# Patient Record
Sex: Female | Born: 1995 | Race: Black or African American | Hispanic: No | Marital: Single | Smoking: Never smoker
Health system: Southern US, Community
[De-identification: ages and names within clinical notes are randomized; demographics above are authoritative.]

## PROBLEM LIST (undated history)

## (undated) DIAGNOSIS — D649 Anemia, unspecified: Secondary | ICD-10-CM

## (undated) HISTORY — PX: NO PAST SURGERIES: SHX2092

---
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
## 2013-11-05 ENCOUNTER — Other Ambulatory Visit: Payer: Self-pay | Admitting: Physician Assistant

## 2013-11-05 DIAGNOSIS — N6321 Unspecified lump in the left breast, upper outer quadrant: Secondary | ICD-10-CM

## 2013-11-11 ENCOUNTER — Inpatient Hospital Stay: Admission: RE | Admit: 2013-11-11 | Payer: Self-pay | Source: Ambulatory Visit

## 2014-01-08 ENCOUNTER — Other Ambulatory Visit: Payer: Self-pay

## 2014-08-23 ENCOUNTER — Encounter (HOSPITAL_COMMUNITY): Payer: Self-pay | Admitting: Emergency Medicine

## 2015-04-22 ENCOUNTER — Emergency Department (HOSPITAL_COMMUNITY)
Admission: EM | Admit: 2015-04-22 | Discharge: 2015-04-22 | Discharge status: Absent without leave | Payer: Self-pay | Source: Home / Self Care

## 2016-09-25 ENCOUNTER — Ambulatory Visit (HOSPITAL_COMMUNITY)
Admission: EM | Admit: 2016-09-25 | Discharge: 2016-09-25 | Disposition: A | Discharge status: Home / Self Care | Payer: Self-pay | Attending: Family Medicine | Admitting: Family Medicine

## 2016-09-25 ENCOUNTER — Encounter (HOSPITAL_COMMUNITY): Payer: Self-pay | Admitting: Emergency Medicine

## 2016-09-25 DIAGNOSIS — M542 Cervicalgia: Secondary | ICD-10-CM

## 2016-09-25 DIAGNOSIS — G43001 Migraine without aura, not intractable, with status migrainosus: Secondary | ICD-10-CM

## 2016-09-25 MED ORDER — KETOROLAC TROMETHAMINE 60 MG/2ML IM SOLN
60.0000 mg | Freq: Once | INTRAMUSCULAR | Status: AC
  Administered 2016-09-25: 60 mg via INTRAMUSCULAR

## 2016-09-25 MED ORDER — CYCLOBENZAPRINE HCL 5 MG PO TABS: 5.0000 mg | ORAL_TABLET | Freq: Every day | ORAL | 0 refills | Status: DC

## 2016-09-25 MED ORDER — NAPROXEN 500 MG PO TABS: 500.0000 mg | ORAL_TABLET | Freq: Two times a day (BID) | ORAL | 0 refills | Status: DC

## 2016-09-25 MED ORDER — KETOROLAC TROMETHAMINE 60 MG/2ML IM SOLN
INTRAMUSCULAR | Status: AC
  Filled 2016-09-25: qty 2
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
## 2016-12-17 ENCOUNTER — Ambulatory Visit (HOSPITAL_COMMUNITY)
Admission: EM | Admit: 2016-12-17 | Discharge: 2016-12-17 | Disposition: A | Discharge status: Home / Self Care | Payer: Self-pay | Attending: Family Medicine | Admitting: Family Medicine

## 2016-12-17 ENCOUNTER — Encounter (HOSPITAL_COMMUNITY): Payer: Self-pay | Admitting: Emergency Medicine

## 2016-12-17 DIAGNOSIS — R109 Unspecified abdominal pain: Secondary | ICD-10-CM

## 2016-12-17 DIAGNOSIS — F419 Anxiety disorder, unspecified: Secondary | ICD-10-CM

## 2016-12-17 DIAGNOSIS — R110 Nausea: Secondary | ICD-10-CM

## 2016-12-17 DIAGNOSIS — K529 Noninfective gastroenteritis and colitis, unspecified: Secondary | ICD-10-CM

## 2016-12-17 MED ORDER — ONDANSETRON 4 MG PO TBDP: 4.0000 mg | ORAL_TABLET | Freq: Three times a day (TID) | ORAL | 0 refills | Status: DC | PRN

## 2016-12-17 MED ORDER — HYOSCYAMINE SULFATE 0.125 MG PO TABS: ORAL_TABLET | ORAL | 0 refills | Status: DC

## 2016-12-17 NOTE — Discharge Instructions (Signed)
[facility] for an appointment to talk about anxiety

## 2016-12-17 NOTE — ED Triage Notes (Signed)
Pt stated abdominal cramping with N/V/D started this morning.

## 2016-12-17 NOTE — ED Provider Notes (Signed)
CSN: [id number]     Arrival date & time 12/17/16  1002 History   First MD Initiated Contact with Patient 12/17/16 1033     Chief Complaint  Patient presents with  . Abdominal Cramping   (Consider location/radiation/quality/duration/timing/severity/associated sxs/prior Treatment) Patient c/o abdominal spasms and pain.  She c/o NVD that started this am.  She c/o anxiety problems and states this makes her abdominal pain worse.   The history is provided by the patient.  Abdominal Cramping  This is a new problem. The current episode started 12 to 24 hours ago. The problem occurs constantly. The problem has not changed since onset.Nothing aggravates the symptoms.    Past Medical History:  Diagnosis Date  . Anemia   . Anemia   . Asthma    Past Surgical History:  Procedure Laterality Date  . NO PAST SURGERIES     History reviewed. No pertinent family history. Social History  Substance Use Topics  . Smoking status: Never Smoker  . Smokeless tobacco: Not on file  . Alcohol use No   OB History    Gravida Para Term Preterm AB Living   1 1 1     1    SAB TAB Ectopic Multiple Live Births           1     Review of Systems  Constitutional: Positive for fatigue.  HENT: Negative.   Eyes: Negative.   Respiratory: Negative.   Cardiovascular: Negative.   Gastrointestinal: Positive for diarrhea and nausea.  Endocrine: Negative.   Genitourinary: Negative.   Musculoskeletal: Negative.   Skin: Negative.   Allergic/Immunologic: Negative.   Neurological: Negative.   Hematological: Negative.   Psychiatric/Behavioral: Negative.     Allergies  Food  Home Medications   Prior to Admission medications   Medication Sig Start Date End Date Taking? Authorizing Provider  ibuprofen (ADVIL,MOTRIN) 600 MG tablet Take 1 tab PO Q6h x 2 days then Q6h prn 01/13/12  Yes Lowanda Foster, NP  hyoscyamine (LEVSIN) 0.125 MG tablet Take one po q 4 hours prn abdominal cramps 12/17/16   Deatra Canter, FNP   ondansetron (ZOFRAN ODT) 4 MG disintegrating tablet Take 1 tablet (4 mg total) by mouth every 8 (eight) hours as needed for nausea or vomiting. 12/17/16   Deatra Canter, FNP   Meds Ordered and Administered this Visit  Medications - No data to display  BP 114/75 (BP Location: Left Arm)   Pulse 72   Temp 98.2 F (36.8 C) (Oral)   Resp 18   SpO2 100%  No data found.   Physical Exam  Constitutional: She appears well-developed and well-nourished.  HENT:  Head: Normocephalic and atraumatic.  Right Ear: External ear normal.  Left Ear: External ear normal.  Mouth/Throat: Oropharynx is clear and moist.  Eyes: Conjunctivae and EOM are normal. Pupils are equal, round, and reactive to light.  Neck: Normal range of motion. Neck supple.  Cardiovascular: Normal rate, regular rhythm and normal heart sounds.   Pulmonary/Chest: Effort normal and breath sounds normal.  Abdominal: Soft. Bowel sounds are normal.  Nursing note and vitals reviewed.   Urgent Care Course     Procedures (including critical care time)  Labs Review Labs Reviewed - No data to display  Imaging Review No results found.   Visual Acuity Review  Right Eye Distance:   Left Eye Distance:   Bilateral Distance:    Right Eye Near:   Left Eye Near:    Bilateral Near:  MDM   1. Gastroenteritis   2. Abdominal spasms   3. Nausea   4. Anxiety    Referral to PCP given to talk about anxiety problems. Zofran ODT 4mg  one po tid prn #21 Levsin 0.125mg  one po q 4 hours prn abdominal cramps #30  Discussed taking otc probiotics      Deatra CanterWilliam J Oxford, FNP 12/17/16 1118

## 2017-07-23 ENCOUNTER — Emergency Department (HOSPITAL_COMMUNITY)
Admission: EM | Admit: 2017-07-23 | Discharge: 2017-07-23 | Disposition: A | Discharge status: Home / Self Care | Payer: Self-pay | Attending: Emergency Medicine | Admitting: Emergency Medicine

## 2017-07-23 ENCOUNTER — Encounter (HOSPITAL_COMMUNITY): Payer: Self-pay | Admitting: Emergency Medicine

## 2017-07-23 DIAGNOSIS — Z5321 Procedure and treatment not carried out due to patient leaving prior to being seen by health care provider: Secondary | ICD-10-CM | POA: Insufficient documentation

## 2017-07-23 LAB — COMPREHENSIVE METABOLIC PANEL
ALT: 19 U/L (ref 14–54)
ANION GAP: 9 (ref 5–15)
AST: 24 U/L (ref 15–41)
Albumin: 4.3 g/dL (ref 3.5–5.0)
Alkaline Phosphatase: 50 U/L (ref 38–126)
BUN: 7 mg/dL (ref 6–20)
CHLORIDE: 101 mmol/L (ref 101–111)
CO2: 23 mmol/L (ref 22–32)
Calcium: 9.1 mg/dL (ref 8.9–10.3)
Creatinine, Ser: 0.6 mg/dL (ref 0.44–1.00)
GFR calc non Af Amer: >60 mL/min (ref >60)
Glucose, Bld: 119 mg/dL — ABNORMAL HIGH (ref 65–99)
POTASSIUM: 3.5 mmol/L (ref 3.5–5.1)
SODIUM: 133 mmol/L — AB (ref 135–145)
Total Bilirubin: 0.5 mg/dL (ref 0.3–1.2)
Total Protein: 7.6 g/dL (ref 6.5–8.1)

## 2017-07-23 LAB — CBC
HCT: 36.5 % (ref 36.0–46.0)
Hemoglobin: 12.5 g/dL (ref 12.0–15.0)
MCH: 30.3 pg (ref 26.0–34.0)
MCHC: 34.2 g/dL (ref 30.0–36.0)
MCV: 88.6 fL (ref 78.0–100.0)
Platelets: 267 10*3/uL (ref 150–400)
RBC: 4.12 MIL/uL (ref 3.87–5.11)
RDW: 13.1 % (ref 11.5–15.5)
WBC: 12 10*3/uL — ABNORMAL HIGH (ref 4.0–10.5)

## 2017-07-23 LAB — I-STAT BETA HCG BLOOD, ED (MC, WL, AP ONLY): I-stat hCG, quantitative: <5 m[IU]/mL (ref <5)

## 2017-07-23 LAB — LIPASE, BLOOD: LIPASE: 28 U/L (ref 11–51)

## 2017-07-23 NOTE — ED Triage Notes (Signed)
Patient here from home with complaints of right upper and lower abdominal pain that started last night. Nausea/ vomiting. Hx of GERD.

## 2017-07-23 NOTE — ED Notes (Signed)
Pt LWBS 

## 2017-07-23 NOTE — ED Notes (Signed)
Pt called for room. No answer 

## 2017-07-23 NOTE — ED Notes (Signed)
Called Pt to be roomed, no response.

## 2018-03-22 ENCOUNTER — Encounter (HOSPITAL_COMMUNITY): Payer: Self-pay | Admitting: Emergency Medicine

## 2018-03-22 ENCOUNTER — Ambulatory Visit (HOSPITAL_COMMUNITY)
Admission: EM | Admit: 2018-03-22 | Discharge: 2018-03-22 | Disposition: A | Discharge status: Home / Self Care | Payer: Self-pay | Attending: Internal Medicine | Admitting: Internal Medicine

## 2018-03-22 ENCOUNTER — Other Ambulatory Visit: Payer: Self-pay

## 2018-03-22 DIAGNOSIS — H60501 Unspecified acute noninfective otitis externa, right ear: Secondary | ICD-10-CM

## 2018-03-22 DIAGNOSIS — H9201 Otalgia, right ear: Secondary | ICD-10-CM

## 2018-03-22 MED ORDER — NEOMYCIN-POLYMYXIN-HC 3.5-10000-1 OT SUSP: 4.0000 [drp] | Freq: Four times a day (QID) | OTIC | 0 refills | Status: AC

## 2018-03-22 NOTE — ED Triage Notes (Signed)
[facility] a [facility] of right ear pain and drainage x 10 days.

## 2018-03-22 NOTE — ED Provider Notes (Signed)
[facility]    CSN: [id number] Arrival date & time: 03/22/18  1326     History   Chief Complaint Chief Complaint  Patient presents with  . Otalgia    HPI Alyssa Andersen is a 22 y.o. female history of asthma presenting today for evaluation of right ear drainage and pain.  Patient states that for the past 10 days she has had pain in her right ear, will notice some drainage especially when lying on her right side.  Pain is worse at night.  Been taking ibuprofen which does help with her pain.  Also noticing decreased hearing in this ear.  States she is starting to feel pain in her left ear as well, but is not as bad as the right side.  Denies associated congestion, sore throat and coughing.  Denies fevers.  Does feel like her symptoms started after she went to bed with wet hair one night.  Denies recent swimming.  HPI  Past Medical History:  Diagnosis Date  . Anemia   . Anemia   . Asthma     Patient Active Problem List   Diagnosis Date Noted  . Asthma     Past Surgical History:  Procedure Laterality Date  . NO PAST SURGERIES      OB History    Gravida  1   Para  1   Term  1   Preterm      AB      Living  1     SAB      TAB      Ectopic      Multiple      Live Births  1            Home Medications    Prior to Admission medications   Medication Sig Start Date End Date Taking? Authorizing Provider  ibuprofen (ADVIL,MOTRIN) 600 MG tablet Take 1 tab PO Q6h x 2 days then Q6h prn 01/13/12  Yes Brewer, Mindy, NP  neomycin-polymyxin-hydrocortisone (CORTISPORIN) 3.5-10000-1 OTIC suspension Place 4 drops into the right ear 4 (four) times daily for 7 days. 03/22/18 03/29/18  Scot Shiraishi, Junius Creamer, PA-C    Family History History reviewed. No pertinent family history.  Social History Social History   Tobacco Use  . Smoking status: Never Smoker  . Smokeless tobacco: Never Used  Substance Use Topics  . Alcohol use: No  . Drug use: No      Allergies   Food   Review of Systems Review of Systems  Constitutional: Negative for chills, fatigue and fever.  HENT: Positive for ear pain. Negative for congestion, rhinorrhea, sinus pressure, sore throat and trouble swallowing.   Respiratory: Negative for cough, chest tightness and shortness of breath.   Cardiovascular: Negative for chest pain.  Gastrointestinal: Negative for abdominal pain, nausea and vomiting.  Musculoskeletal: Negative for myalgias.  Skin: Negative for rash.  Neurological: Negative for dizziness, light-headedness and headaches.     Physical Exam Triage Vital Signs ED Triage Vitals [03/22/18 1423]  Enc Vitals Group     BP 111/70     Pulse Rate 77     Resp 18     Temp 98.2 F (36.8 C)     Temp Source Oral     SpO2 96 %     Weight      Height      Head Circumference      Peak Flow      Pain Score 0  Pain Loc      Pain Edu?      Excl. in GC?    No data found.  Updated Vital Signs BP 111/70 (BP Location: Right Arm)   Pulse 77   Temp 98.2 F (36.8 C) (Oral)   Resp 18   SpO2 96%   Visual Acuity Right Eye Distance:   Left Eye Distance:   Bilateral Distance:    Right Eye Near:   Left Eye Near:    Bilateral Near:     Physical Exam  Constitutional: She appears well-developed and well-nourished. No distress.  HENT:  Head: Normocephalic and atraumatic.  Mouth/Throat: Oropharynx is clear and moist.  Eyes: Conjunctivae are normal.  Left EAC patent, TM nonerythematous, right ear with tenderness to palpation of tragus, nontender to palpation of auricle and mastoid, EAC with erythema and swelling, TM partially visualized, but did not appear erythematous.  Neck: Neck supple.  Cardiovascular: Normal rate and regular rhythm.  No murmur heard. Pulmonary/Chest: Effort normal and breath sounds normal. No respiratory distress.  Abdominal: Soft. There is no tenderness.  Musculoskeletal: She exhibits no edema.  Neurological: She is alert.   Skin: Skin is warm and dry.  Psychiatric: She has a normal mood and affect.  Nursing note and vitals reviewed.    UC Treatments / Results  Labs (all labs ordered are listed, but only abnormal results are displayed) Labs Reviewed - No data to display  EKG None  Radiology No results found.  Procedures Procedures (including critical care time)  Medications Ordered in UC Medications - No data to display  Initial Impression / Assessment and Plan / UC Course  I have reviewed the triage vital signs and the nursing notes.  Pertinent labs & imaging results that were available during my care of the patient were reviewed by me and considered in my medical decision making (see chart for details).     Patient with otitis externa, will treat with Cortisporin.  Also discussed trying to keep ear dry.  Tylenol and ibuprofen for further pain management.Discussed strict return precautions. Patient verbalized understanding and is agreeable with plan.  Final Clinical Impressions(s) / UC Diagnoses   Final diagnoses:  Acute otitis externa of right ear, unspecified type     Discharge Instructions     Use 1-2 ear drops every 4-6 hours daily for 7 days  Use hair dryer after showering to dry ear canal  Follow up if symptoms not improving or worsening   ED Prescriptions    Medication Sig Dispense Auth. Provider   neomycin-polymyxin-hydrocortisone (CORTISPORIN) 3.5-10000-1 OTIC suspension Place 4 drops into the right ear 4 (four) times daily for 7 days. 10 mL Mat Stuard C, PA-C     Controlled Substance Prescriptions [place] Controlled Substance Registry consulted? Not Applicable   Lew DawesWieters, Fabiana Dromgoole C, [place]PA-C 03/22/18 1502

## 2018-03-22 NOTE — Discharge Instructions (Signed)
Use 1-2 ear drops every 4-6 hours daily for 7 days  Use hair dryer after showering to dry ear canal  Follow up if symptoms not improving or worsening

## 2018-10-20 ENCOUNTER — Ambulatory Visit (HOSPITAL_COMMUNITY)
Admission: EM | Admit: 2018-10-20 | Discharge: 2018-10-20 | Disposition: A | Discharge status: Home / Self Care | Payer: Medicaid Other | Attending: Physician Assistant | Admitting: Physician Assistant

## 2018-10-20 ENCOUNTER — Encounter (HOSPITAL_COMMUNITY): Payer: Self-pay | Admitting: Physician Assistant

## 2018-10-20 ENCOUNTER — Other Ambulatory Visit: Payer: Self-pay

## 2018-10-20 DIAGNOSIS — J069 Acute upper respiratory infection, unspecified: Secondary | ICD-10-CM | POA: Insufficient documentation

## 2018-10-20 MED ORDER — IPRATROPIUM BROMIDE 0.06 % NA SOLN: 2.0000 | Freq: Four times a day (QID) | NASAL | 0 refills | Status: DC

## 2018-10-20 MED ORDER — BENZONATATE 100 MG PO CAPS: 100.0000 mg | ORAL_CAPSULE | Freq: Three times a day (TID) | ORAL | 0 refills | Status: DC

## 2018-10-20 MED ORDER — IBUPROFEN 800 MG PO TABS: 800.0000 mg | ORAL_TABLET | Freq: Three times a day (TID) | ORAL | 0 refills | Status: DC

## 2018-10-20 NOTE — Discharge Instructions (Signed)
Tessalon for cough. Ibuprofen for pain/fever. Continue dayquil/nyquil as needed. Start atrovent nasal spray for nasal congestion/drainage. You can use over the counter nasal saline rinse such as neti pot for nasal congestion. Keep hydrated, your urine should be clear to pale yellow in color. Tylenol/motrin for fever and pain. Monitor for any worsening of symptoms, chest pain, shortness of breath, wheezing, swelling of the throat, follow up for reevaluation.   For sore throat/cough try using a honey-based tea. Use 3 teaspoons of honey with juice squeezed from half lemon. Place shaved pieces of ginger into 1/2-1 cup of water and warm over stove top. Then mix the ingredients and repeat every 4 hours as needed.

## 2018-10-20 NOTE — ED Provider Notes (Signed)
[facility]    CSN: [id number] Arrival date & time: 10/20/18  1622     History   Chief Complaint Chief Complaint  Patient presents with  . Cough    HPI Alyssa Andersen is a 22 y.o. female.   22 year old female comes in for 2 day history of URI symptoms. Has had cough, body aches, headaches, subjective fever. Has had decreased appetite. Denies rhinorrhea, nasal congestion. Denies nausea, vomiting abdominal pain. Has been taking otc cold medicine with temporary relief. Current some day smoker. No obvious sick contact. Has not needed to use albuterol inhaler.      Past Medical History:  Diagnosis Date  . Anemia   . Anemia   . Asthma     Patient Active Problem List   Diagnosis Date Noted  . Asthma     Past Surgical History:  Procedure Laterality Date  . NO PAST SURGERIES      OB History    Gravida  1   Para  1   Term  1   Preterm      AB      Living  1     SAB      TAB      Ectopic      Multiple      Live Births  1            Home Medications    Prior to Admission medications   Medication Sig Start Date End Date Taking? Authorizing Provider  benzonatate (TESSALON) 100 MG capsule Take 1 capsule (100 mg total) by mouth every 8 (eight) hours. 10/20/18   Cathie HoopsYu, Amy V, PA-C  ibuprofen (ADVIL,MOTRIN) 800 MG tablet Take 1 tablet (800 mg total) by mouth 3 (three) times daily. 10/20/18   Cathie HoopsYu, Amy V, PA-C  ipratropium (ATROVENT) 0.06 % nasal spray Place 2 sprays into both nostrils 4 (four) times daily. 10/20/18   Belinda FisherYu, Amy V, PA-C    Family History No family history on file.  Social History Social History   Tobacco Use  . Smoking status: Never Smoker  . Smokeless tobacco: Never Used  Substance Use Topics  . Alcohol use: No  . Drug use: No     Allergies   Food   Review of Systems Review of Systems  Reason unable to perform ROS: See HPI as above.     Physical Exam Triage Vital Signs ED Triage Vitals  Enc Vitals Group      BP 10/20/18 1723 124/78     Pulse --      Resp 10/20/18 1723 18     Temp 10/20/18 1723 99.2 F (37.3 C)     Temp src --      SpO2 10/20/18 1723 100 %     Weight 10/20/18 1722 150 lb (68 kg)     Height --      Head Circumference --      Peak Flow --      Pain Score --      Pain Loc --      Pain Edu? --      Excl. in GC? --    No data found.  Updated Vital Signs BP 124/78 (BP Location: Right Arm)   Temp 99.2 F (37.3 C)   Resp 18   Wt 150 lb (68 kg)   LMP 09/30/2018   SpO2 100%   Physical Exam Constitutional:      General: She is not in acute distress.  Appearance: She is well-developed. She is not ill-appearing, toxic-appearing or diaphoretic.  HENT:     Head: Normocephalic and atraumatic.     Right Ear: Tympanic membrane, ear canal and external ear normal. Tympanic membrane is not erythematous or bulging.     Left Ear: Tympanic membrane, ear canal and external ear normal. Tympanic membrane is not erythematous or bulging.     Nose: Nose normal.     Right Sinus: No maxillary sinus tenderness or frontal sinus tenderness.     Left Sinus: No maxillary sinus tenderness or frontal sinus tenderness.     Mouth/Throat:     Pharynx: Uvula midline.  Eyes:     Conjunctiva/sclera: Conjunctivae normal.     Pupils: Pupils are equal, round, and reactive to light.  Neck:     Musculoskeletal: Normal range of motion and neck supple.  Cardiovascular:     Rate and Rhythm: Normal rate and regular rhythm.     Heart sounds: Normal heart sounds. No murmur. No friction rub. No gallop.   Pulmonary:     Effort: Pulmonary effort is normal.     Breath sounds: Normal breath sounds. No decreased breath sounds, wheezing, rhonchi or rales.  Lymphadenopathy:     Cervical: No cervical adenopathy.  Skin:    General: Skin is warm and dry.  Neurological:     Mental Status: She is alert and oriented to person, place, and time.  Psychiatric:        Behavior: Behavior normal.        Judgment:  Judgment normal.      UC Treatments / Results  Labs (all labs ordered are listed, but only abnormal results are displayed) Labs Reviewed - No data to display  EKG None  Radiology No results found.  Procedures Procedures (including critical care time)  Medications Ordered in UC Medications - No data to display  Initial Impression / Assessment and Plan / UC Course  I have reviewed the triage vital signs and the nursing notes.  Pertinent labs & imaging results that were available during my care of the patient were reviewed by me and considered in my medical decision making (see chart for details).    Discussed with patient history and exam most consistent with viral URI. Symptomatic treatment as needed. Push fluids. Return precautions given.   Final Clinical Impressions(s) / UC Diagnoses   Final diagnoses:  Acute upper respiratory infection    ED Prescriptions    Medication Sig Dispense Auth. Provider   ipratropium (ATROVENT) 0.06 % nasal spray Place 2 sprays into both nostrils 4 (four) times daily. 15 mL Yu, Amy V, PA-C   benzonatate (TESSALON) 100 MG capsule Take 1 capsule (100 mg total) by mouth every 8 (eight) hours. 21 capsule Yu, Amy V, PA-C   ibuprofen (ADVIL,MOTRIN) 800 MG tablet Take 1 tablet (800 mg total) by mouth 3 (three) times daily. 21 tablet Threasa AlphaYu, Amy V, PA-C        Yu, Amy V, [place]PA-C 10/20/18 [phone]

## 2018-10-20 NOTE — ED Triage Notes (Signed)
Pt cc cough , body aches, fever and headaches this started yesterday..Marland Kitchen

## 2019-05-14 ENCOUNTER — Encounter (HOSPITAL_COMMUNITY): Payer: Self-pay | Admitting: Emergency Medicine

## 2019-05-14 ENCOUNTER — Ambulatory Visit (HOSPITAL_COMMUNITY)
Admission: EM | Admit: 2019-05-14 | Discharge: 2019-05-14 | Disposition: A | Discharge status: Home / Self Care | Payer: Self-pay | Attending: Emergency Medicine | Admitting: Emergency Medicine

## 2019-05-14 ENCOUNTER — Other Ambulatory Visit: Payer: Self-pay

## 2019-05-14 DIAGNOSIS — Z113 Encounter for screening for infections with a predominantly sexual mode of transmission: Secondary | ICD-10-CM | POA: Insufficient documentation

## 2019-05-14 DIAGNOSIS — Z3490 Encounter for supervision of normal pregnancy, unspecified, unspecified trimester: Secondary | ICD-10-CM | POA: Insufficient documentation

## 2019-05-14 DIAGNOSIS — N898 Other specified noninflammatory disorders of vagina: Secondary | ICD-10-CM | POA: Insufficient documentation

## 2019-05-14 LAB — POCT PREGNANCY, URINE: Preg Test, Ur: POSITIVE — AB

## 2019-05-14 MED ORDER — METRONIDAZOLE 500 MG PO TABS: 500.0000 mg | ORAL_TABLET | Freq: Two times a day (BID) | ORAL | 0 refills | Status: AC

## 2019-05-14 MED ORDER — CLOTRIMAZOLE 2 % VA CREA: 1.0000 | TOPICAL_CREAM | Freq: Every day | VAGINAL | 0 refills | Status: AC

## 2019-05-14 NOTE — ED Provider Notes (Signed)
HPI  SUBJECTIVE:  Alyssa Andersen is a G2, 55P1 23 y.o. female who presents for STD and pregnancy testing.  She reports having a painful blistery rash along her right lower outer labia/junction of upper right thigh 3 weeks ago.  This has since resolved.  She also reports odorous thick white vaginal discharge.  She tried an OTC vaginal cream without improvement in her symptoms.  finished this 2 days ago.  No other aggravating or alleviating factors.  She denies dysuria, urgency, frequency, cloudy urine.  She reports odorous urine.  No hematuria.  No vaginal itching.  No current genital rash, blisters.  No abdominal, back, pelvic pain.  No abnormal vaginal bleeding.  No fevers.  She has not taken a pregnancy test at home.  She is in a monogamous relationship with a female who is asymptomatic currently but has a possible history of herpes.  She was on antibiotics 3 weeks ago.  She states that she is using a new perfumed body wash.  She has a past medical history of gonorrhea, chlamydia, yeast infections, and UTI.  No history of HIV, HIV, syphilis, trichomonas, BV, PID, ectopic pregnancy, pyelonephritis, diabetes.  LMP: 6/18.  [facility]    Past Medical History:  Diagnosis Date  . Anemia   . Anemia   . Asthma     Past Surgical History:  Procedure Laterality Date  . NO PAST SURGERIES      No family history on file.  Social History   Tobacco Use  . Smoking status: Never Smoker  . Smokeless tobacco: Never Used  Substance Use Topics  . Alcohol use: No  . Drug use: No    No current facility-administered medications for this encounter.   Current Outpatient Medications:  .  clotrimazole (GYNE-LOTRIMIN 3) 2 % vaginal cream, Place 1 Applicatorful vaginally at bedtime., Disp: 21 g, Rfl: 0 .  metroNIDAZOLE (FLAGYL) 500 MG tablet, Take 1 tablet (500 mg total) by mouth 2 (two) times daily for 7 days., Disp: 14 tablet, Rfl: 0  Allergies  Allergen Reactions  . Food  Swelling    Bananas, apples     ROS  As noted in HPI.   Physical Exam  BP 121/71   Pulse 97   Temp 99.2 F (37.3 C) (Oral)   Resp 16   SpO2 99%   Constitutional: Well developed, well nourished, no acute distress Eyes:  EOMI, conjunctiva normal bilaterally HENT: Normocephalic, atraumatic,mucus membranes moist Respiratory: Normal inspiratory effort Cardiovascular: Normal rate GI: nondistended soft, nontender. No suprapubic tenderness  back: No CVA tenderness GU: External genitalia normal.  No blisters, ulcers.  Positive flat hypopigmented  healed rash at the junction of the right buttock and right external labia.  No crusting, active blisters.  There is nothing to swab.  Normal vaginal mucosa.  Normal os. thick oderous white vaginal discharge.   Uterus smooth, NT. No  CMT. No adnexal tenderness. No adnexal masses.  Chaperone present during exam skin: No rash, skin intact Musculoskeletal: no deformities Neurologic: Alert & oriented x 3, no focal neuro deficits Psychiatric: Speech and behavior appropriate   ED Course   Medications - No data to display  Orders Placed This Encounter  Procedures  . Pelvic exam    Standing Status:   Standing    Number of Occurrences:   1  . RPR    Standing Status:   Standing    Number of Occurrences:   1  . HIV antibody  Standing Status:   Standing    Number of Occurrences:   1  . POC urine pregnancy    Standing Status:   Standing    Number of Occurrences:   1  . Pregnancy, urine POC    Standing Status:   Standing    Number of Occurrences:   1    Results for orders placed or performed during the hospital encounter of 05/14/19 (from the past 24 hour(s))  Pregnancy, urine POC     Status: Abnormal   Collection Time: 05/14/19  1:21 PM  Result Value Ref Range   Preg Test, Ur POSITIVE (A) NEGATIVE   No results found.  ED Clinical Impression  1. Pregnancy at early stage   2. Screen for STD (sexually transmitted disease)   3.  Vaginal discharge      ED Assessment/Plan  Urine pregnancy positive.  Discussed this with patient.  She will restart prenatal vitamins-states that she does not need a prescription.  H&P most c/w yeast infection versus BV.  Will send home with clotrimazole 2% 1 applicator vaginally for 3 nights because she is pregnant, and a wait-and-see prescription of Flagyl.  She will call here if she does not hear from [place] in 2 days and start Flagyl if necessary.  Will provide an OB/GYN list for ongoing care.  She gave [place] a dirty urine sample, so was unable to test for UTI, but she has no urinary symptoms.  Suspect odorous urine is from the vaginal discharge. she was unable to give [place] an additional sample while in clinic today.  Sent off GC/chlamydia, wet prep, HIV, RPR. Will not treat empirically now.  Advised pt to refrain from sexual contact until she knows lab results, symptoms resolve, and partner(s) are treated if necessary. Pt provided working phone number. Discussed labs, MDM, plan and followup with patient. Pt agrees with plan.   Meds ordered this encounter  Medications  . metroNIDAZOLE (FLAGYL) 500 MG tablet    Sig: Take 1 tablet (500 mg total) by mouth 2 (two) times daily for 7 days.    Dispense:  14 tablet    Refill:  0  . clotrimazole (GYNE-LOTRIMIN 3) 2 % vaginal cream    Sig: Place 1 Applicatorful vaginally at bedtime.    Dispense:  21 g    Refill:  0    *This clinic note was created using [employer]. Therefore, there may be occasional mistakes despite careful proofreading.  ?    Melynda Ripple, MD 05/14/19 [phone]

## 2019-05-14 NOTE — Discharge Instructions (Addendum)
I am treating you empirically for a yeast infection.  Save the Flagyl prescription until you know what your results are.  Call here in a day or 2 if you do not hear from [place].  Start the Flagyl if your labs come back positive for bacterial vaginosis.  We have also sent off gonorrhea, chlamydia, syphilis, HIV testing as well.  Restart the prenatal vitamins.  [place] [facility] [employer] [facility] at [facility]       Phone: [phone]  [place] [facility] [employer] [facility] at [place]   Phone: [place] for [employer] [facility] at [place]  Phone: [place] for [place] [facility]  Phone: [place] [facility] [place] [facility] [place]  Phone: [place] for [place] [facility]   Phone: [facility]       Phone: [phone]  [facility]    Phone: [phone]   [facility]    Phone: [phone]  [facility]    Phone: [place]    Phone: [phone]  [place] [facility]       Phone: [phone]   [place] [facility]  Phone: [place]    Phone: [phone]  [facility]   Phone: [phone]  [facility]      Phone: [phone]  [facility]    Phone: [phone]

## 2019-05-14 NOTE — ED Triage Notes (Signed)
PT requests STD testing, herpes testing, and a pregnancy test.

## 2019-05-15 LAB — HIV ANTIBODY (ROUTINE TESTING W REFLEX): HIV Screen 4th Generation wRfx: NONREACTIVE

## 2019-05-15 LAB — RPR: RPR Ser Ql: NONREACTIVE

## 2019-05-19 LAB — CERVICOVAGINAL ANCILLARY ONLY
Bacterial vaginitis: POSITIVE — AB
Candida vaginitis: NEGATIVE
Chlamydia: POSITIVE — AB
Neisseria Gonorrhea: POSITIVE — AB
Trichomonas: NEGATIVE

## 2019-05-21 ENCOUNTER — Telehealth (HOSPITAL_COMMUNITY): Payer: Self-pay | Admitting: Emergency Medicine

## 2019-05-21 ENCOUNTER — Ambulatory Visit (HOSPITAL_COMMUNITY)
Admission: EM | Admit: 2019-05-21 | Discharge: 2019-05-21 | Disposition: A | Discharge status: Home / Self Care | Payer: Self-pay | Attending: Internal Medicine | Admitting: Internal Medicine

## 2019-05-21 DIAGNOSIS — A549 Gonococcal infection, unspecified: Secondary | ICD-10-CM

## 2019-05-21 MED ORDER — AZITHROMYCIN 250 MG PO TABS
1000.0000 mg | ORAL_TABLET | Freq: Once | ORAL | Status: AC
  Administered 2019-05-21: 16:00:00 1000 mg via ORAL

## 2019-05-21 MED ORDER — CEFTRIAXONE SODIUM 250 MG IJ SOLR
INTRAMUSCULAR | Status: AC
  Filled 2019-05-21: qty 250

## 2019-05-21 MED ORDER — CEFTRIAXONE SODIUM 250 MG IJ SOLR
250.0000 mg | Freq: Once | INTRAMUSCULAR | Status: AC
  Administered 2019-05-21: 250 mg via INTRAMUSCULAR

## 2019-05-21 MED ORDER — AZITHROMYCIN 250 MG PO TABS
ORAL_TABLET | ORAL | Status: AC
  Filled 2019-05-21: qty 4

## 2019-05-21 NOTE — Telephone Encounter (Signed)
Bacterial Vaginosis test is positive.  Prescription for metronidazole was given at the urgent care visit. Pt contacted regarding results. Answered all questions. Verbalized understanding.  Chlamydia is positive.  Rx po zithromax 1g #1 dose no refills was sent to the pharmacy of record.  Pt needs education to please refrain from sexual intercourse for 7 days to give the medicine time to work, sexual partners need to be notified and tested/treated.  Condoms may reduce risk of reinfection.  Recheck or followup with PCP for further evaluation if symptoms are not improving.   [facility] notified.  Gonorrhea is positive.  Patient should return as soon as possible to the urgent care for treatment with IM rocephin 250mg  and po zithromax 1g. Patient will not need to see a provider unless there are new symptoms she would like evaluated. Pt needs education to refrain from sexual intercourse for now and for 7 days after treatment to give the medicine time to work. Sexual partners need to be notified and tested/treated. Condoms may reduce risk of reinfection. [facility] notified.   Pt contacted, will return today for treatment.

## 2019-06-02 ENCOUNTER — Other Ambulatory Visit: Payer: Self-pay

## 2019-06-02 ENCOUNTER — Encounter (HOSPITAL_COMMUNITY): Payer: Self-pay

## 2019-06-02 ENCOUNTER — Inpatient Hospital Stay (HOSPITAL_COMMUNITY)
Admission: AD | Admit: 2019-06-02 | Discharge: 2019-06-03 | Disposition: A | Discharge status: Home / Self Care | Payer: Medicaid Other | Attending: Family Medicine | Admitting: Family Medicine

## 2019-06-02 DIAGNOSIS — O210 Mild hyperemesis gravidarum: Secondary | ICD-10-CM | POA: Diagnosis present

## 2019-06-02 DIAGNOSIS — O211 Hyperemesis gravidarum with metabolic disturbance: Secondary | ICD-10-CM | POA: Diagnosis not present

## 2019-06-02 DIAGNOSIS — O98219 Gonorrhea complicating pregnancy, unspecified trimester: Secondary | ICD-10-CM | POA: Insufficient documentation

## 2019-06-02 DIAGNOSIS — Z3A08 8 weeks gestation of pregnancy: Secondary | ICD-10-CM | POA: Diagnosis not present

## 2019-06-02 DIAGNOSIS — A749 Chlamydial infection, unspecified: Secondary | ICD-10-CM | POA: Insufficient documentation

## 2019-06-02 DIAGNOSIS — E860 Dehydration: Secondary | ICD-10-CM | POA: Diagnosis not present

## 2019-06-02 DIAGNOSIS — O98819 Other maternal infectious and parasitic diseases complicating pregnancy, unspecified trimester: Secondary | ICD-10-CM | POA: Insufficient documentation

## 2019-06-02 DIAGNOSIS — O219 Vomiting of pregnancy, unspecified: Secondary | ICD-10-CM | POA: Diagnosis not present

## 2019-06-02 DIAGNOSIS — Z20828 Contact with and (suspected) exposure to other viral communicable diseases: Secondary | ICD-10-CM | POA: Insufficient documentation

## 2019-06-02 LAB — URINALYSIS, ROUTINE W REFLEX MICROSCOPIC
Bacteria, UA: NONE SEEN
Bilirubin Urine: NEGATIVE
Glucose, UA: NEGATIVE mg/dL
Hgb urine dipstick: NEGATIVE
Ketones, ur: 80 mg/dL — AB
Nitrite: NEGATIVE
Protein, ur: 30 mg/dL — AB
Specific Gravity, Urine: 1.029 (ref 1.005–1.030)
pH: 6 (ref 5.0–8.0)

## 2019-06-02 LAB — SARS CORONAVIRUS 2 BY RT PCR (HOSPITAL ORDER, PERFORMED IN ~~LOC~~ HOSPITAL LAB): SARS Coronavirus 2: NEGATIVE

## 2019-06-02 MED ORDER — PROMETHAZINE HCL 25 MG/ML IJ SOLN
12.5000 mg | Freq: Once | INTRAMUSCULAR | Status: AC
  Administered 2019-06-02: 12.5 mg via INTRAVENOUS
  Filled 2019-06-02: qty 1

## 2019-06-02 MED ORDER — SODIUM CHLORIDE 0.9 % IV SOLN
INTRAVENOUS | Status: DC
  Administered 2019-06-02 (×2): via INTRAVENOUS

## 2019-06-02 NOTE — MAU Note (Signed)
Pt states that 2 weeks ago she started having N/V.   This week has been the worst because she can not keep anything down.   Pt reports feeling dizzy at times.

## 2019-06-02 NOTE — MAU Provider Note (Signed)
Chief Complaint: Nausea and Emesis   First Provider Initiated Contact with Patient 06/02/19 2124        SUBJECTIVE HPI: Alyssa Andersen is a 23 y.o. G2P1001 at 4839w1d by LMP who presents to maternity admissions reporting nausea and vomiting for 2 weeks, worse this week.  [facility].  .Was not prescribed any medications by office.    She denies vaginal bleeding, vaginal itching/burning, urinary symptoms, h/a.   Also complained of intermittent fever and chills, shortness of breath that comes and goes, and loss of taste.      Emesis  This is a new problem. The current episode started 1 to 4 weeks ago. The emesis has an appearance of stomach contents. Maximum temperature: Did not take temp. The fever has been present for 5 days or more. Associated symptoms include chills, dizziness and a fever. Pertinent negatives include no abdominal pain, coughing, diarrhea, headaches or myalgias. She has tried nothing for the symptoms.    Past Medical History:  Diagnosis Date  . Anemia   . Anemia   . Asthma    Past Surgical History:  Procedure Laterality Date  . NO PAST SURGERIES     Social History   Socioeconomic History  . Marital status: Single    Spouse name: Not on file  . Number of children: Not on file  . Years of education: Not on file  . Highest education level: Not on file  Occupational History  . Not on file  Social Needs  . Financial resource strain: Not on file  . Food insecurity    Worry: Not on file    Inability: Not on file  . Transportation needs    Medical: Not on file    Non-medical: Not on file  Tobacco Use  . Smoking status: Never Smoker  . Smokeless tobacco: Never Used  Substance and Sexual Activity  . Alcohol use: No  . Drug use: Yes    Types: Marijuana  . Sexual activity: Yes  Lifestyle  . Physical activity    Days per week: Not on file    Minutes per session: Not on file  . Stress: Not on file  Relationships  . Social  Musicianconnections    Talks on phone: Not on file    Gets together: Not on file    Attends religious service: Not on file    Active member of club or organization: Not on file    Attends meetings of clubs or organizations: Not on file    Relationship status: Not on file  . Intimate partner violence    Fear of current or ex partner: Not on file    Emotionally abused: Not on file    Physically abused: Not on file    Forced sexual activity: Not on file  Other Topics Concern  . Not on file  Social History Narrative  . Not on file   No current facility-administered medications on file prior to encounter.    Current Outpatient Medications on File Prior to Encounter  Medication Sig Dispense Refill  . clotrimazole (GYNE-LOTRIMIN 3) 2 % vaginal cream Place 1 Applicatorful vaginally at bedtime. 21 g 0  . [DISCONTINUED] ipratropium (ATROVENT) 0.06 % nasal spray Place 2 sprays into both nostrils 4 (four) times daily. 15 mL 0   Allergies  Allergen Reactions  . Food Swelling    Bananas, apples    I have reviewed patient's Past Medical Hx, Surgical Hx, Family Hx,  Social Hx, medications and allergies.   ROS:  Review of Systems  Constitutional: Positive for chills and fever.  Respiratory: Negative for cough.   Gastrointestinal: Positive for vomiting. Negative for abdominal pain and diarrhea.  Musculoskeletal: Negative for myalgias.  Neurological: Positive for dizziness. Negative for headaches.   Review of Systems  Other systems negative   Physical Exam  Physical Exam Patient Vitals for the past 24 hrs:  BP Temp Pulse Resp SpO2 Height Weight  06/02/19 2104 123/78 99.4 F (37.4 C) 100 16 98 % - -  06/02/19 2054 - - - - - 5\' 4"  (1.626 m) 55.9 kg   Vitals:   06/02/19 2054 06/02/19 2104 06/03/19 0130  BP:  123/78 103/61  Pulse:  100 92  Resp:  16 12  Temp:  99.4 F (37.4 C) 98.9 F (37.2 C)  SpO2:  98% 100%  Weight: 55.9 kg    Height: 5\' 4"  (1.626 m)      Constitutional:  Well-developed female in no acute distress.   Cardiovascular: normal rate, regular rhythm.   Respiratory: normal effort  Oxygen saturation 98%.  Does not appear dyspneic GI: Abd soft, non-tender. Pos BS x 4 MS: Extremities nontender, no edema, normal ROM Neurologic: Alert and oriented x 4.  GU: Neg CVAT.  PELVIC EXAM: deferred  LAB RESULTS Results for orders placed or performed during the hospital encounter of 06/02/19 (from the past 24 hour(s))  Urinalysis, Routine w reflex microscopic     Status: Abnormal   Collection Time: 06/02/19  9:09 PM  Result Value Ref Range   Color, Urine YELLOW YELLOW   APPearance HAZY (A) CLEAR   Specific Gravity, Urine 1.029 1.005 - 1.030   pH 6.0 5.0 - 8.0   Glucose, UA NEGATIVE NEGATIVE mg/dL   Hgb urine dipstick NEGATIVE NEGATIVE   Bilirubin Urine NEGATIVE NEGATIVE   Ketones, ur 80 (A) NEGATIVE mg/dL   Protein, ur 30 (A) NEGATIVE mg/dL   Nitrite NEGATIVE NEGATIVE   Leukocytes,Ua LARGE (A) NEGATIVE   RBC / HPF 6-10 0 - 5 RBC/hpf   WBC, UA 11-20 0 - 5 WBC/hpf   Bacteria, UA NONE SEEN NONE SEEN   Squamous Epithelial / LPF 0-5 0 - 5   Mucus PRESENT       IMAGING No results found.  MAU Management/MDM: Ordered Normal Saline IV hydration WIll give Promethazine IV for nausea.  No vomiting seen but does spit into bag frequently Has not had to void after first two liters of saline Will give a third liter of LR for additional hydration.  Was able to keep down crackers and water and requested to go home after third liter of fluid.  Will prescribe Diclegis to start with.  Discussed how to take it.  If it does not help, she is to call the office to have them prescribe something else.  Discussed slow advancement of diet.   ASSESSMENT Pregnancy at [redacted]w[redacted]d Nausea and vomiting  Dehydration  PLAN Discharge home Rx Diclegis for prn use at home for nausea. Followup in office for ongoing prenatal care Pt stable at time of discharge. Encouraged to return  here or to other Urgent Care/ED if she develops worsening of symptoms, increase in pain, fever, or other concerning symptoms.    Hansel Feinstein CNM, MSN Certified Nurse-Midwife 06/02/2019  9:45 PM

## 2019-06-03 DIAGNOSIS — E860 Dehydration: Secondary | ICD-10-CM

## 2019-06-03 DIAGNOSIS — Z3A08 8 weeks gestation of pregnancy: Secondary | ICD-10-CM

## 2019-06-03 DIAGNOSIS — O219 Vomiting of pregnancy, unspecified: Secondary | ICD-10-CM

## 2019-06-03 MED ORDER — DOXYLAMINE-PYRIDOXINE 10-10 MG PO TBEC: 2.0000 | DELAYED_RELEASE_TABLET | Freq: Every evening | ORAL | 2 refills | Status: AC | PRN

## 2019-06-03 MED ORDER — LACTATED RINGERS IV BOLUS
1000.0000 mL | Freq: Once | INTRAVENOUS | Status: AC
  Administered 2019-06-03: 1000 mL via INTRAVENOUS

## 2019-06-03 NOTE — Discharge Instructions (Signed)
Morning Sickness ° °Morning sickness is when a woman feels nauseous during pregnancy. This nauseous feeling may or may not come with vomiting. It often occurs in the morning, but it can be a problem at any time of day. Morning sickness is most common during the first trimester. In some cases, it may continue throughout pregnancy. Although morning sickness is unpleasant, it is usually harmless unless the woman develops severe and continual vomiting (hyperemesis gravidarum), a condition that requires more intense treatment. °What are the causes? °The exact cause of this condition is not known, but it seems to be related to normal hormonal changes that occur in pregnancy. °What increases the risk? °You are more likely to develop this condition if: °· You experienced nausea or vomiting before your pregnancy. °· You had morning sickness during a previous pregnancy. °· You are pregnant with more than one baby, such as twins. °What are the signs or symptoms? °Symptoms of this condition include: °· Nausea. °· Vomiting. °How is this diagnosed? °This condition is usually diagnosed based on your signs and symptoms. °How is this treated? °In many cases, treatment is not needed for this condition. Making some changes to what you eat may help to control symptoms. Your health care provider may also prescribe or recommend: °· Vitamin B6 supplements. °· Anti-nausea medicines. °· Ginger. °Follow these instructions at home: °Medicines °· Take over-the-counter and prescription medicines only as told by your health care provider. Do not use any prescription, over-the-counter, or herbal medicines for morning sickness without first talking with your health care provider. °· Taking multivitamins before getting pregnant can prevent or decrease the severity of morning sickness in most women. °Eating and drinking °· Eat a piece of dry toast or crackers before getting out of bed in the morning. °· Eat 5 or 6 small meals a day. °· Eat dry and  bland foods, such as rice or a baked potato. Foods that are high in carbohydrates are often helpful. °· Avoid greasy, fatty, and spicy foods. °· Have someone cook for you if the smell of any food causes nausea and vomiting. °· If you feel nauseous after taking prenatal vitamins, take the vitamins at night or with a snack. °· Snack on protein foods between meals if you are hungry. Nuts, yogurt, and cheese are good options. °· Drink fluids throughout the day. °· Try ginger ale made with real ginger, ginger tea made from fresh grated ginger, or ginger candies. °General instructions °· Do not use any products that contain nicotine or tobacco, such as cigarettes and e-cigarettes. If you need help quitting, ask your health care provider. °· Get an air purifier to keep the air in your house free of odors. °· Get plenty of fresh air. °· Try to avoid odors that trigger your nausea. °· Consider trying these methods to help relieve symptoms: °? Wearing an acupressure wristband. These wristbands are often worn for seasickness. °? Acupuncture. °Contact a health care provider if: °· Your home remedies are not working and you need medicine. °· You feel dizzy or light-headed. °· You are losing weight. °Get help right away if: °· You have persistent and uncontrolled nausea and vomiting. °· You faint. °· You have severe pain in your abdomen. °Summary °· Morning sickness is when a woman feels nauseous during pregnancy. This nauseous feeling may or may not come with vomiting. °· Morning sickness is most common during the first trimester. °· It often occurs in the morning, but it can be a problem at   any time of day. °· In many cases, treatment is not needed for this condition. Making some changes to what you eat may help to control symptoms. °This information is not intended to replace advice given to you by your health care provider. Make sure you discuss any questions you have with your health care provider. °Document Released:  11/29/2006 Document Revised: 09/20/2017 Document Reviewed: 11/10/2016 °Elsevier Patient Education © 2020 [employer] ° °
